# Patient Record
Sex: Female | Born: 1995 | Race: White | Hispanic: No | Marital: Single
Health system: Southern US, Community
[De-identification: ages and names within clinical notes are randomized; demographics above are authoritative.]

---

## 2020-04-21 ENCOUNTER — Emergency Department (HOSPITAL_COMMUNITY)
Admission: EM | Admit: 2020-04-21 | Discharge: 2020-04-21 | Disposition: A | Discharge status: Home / Self Care | Attending: Emergency Medicine | Admitting: Emergency Medicine

## 2020-04-21 ENCOUNTER — Other Ambulatory Visit: Payer: Self-pay

## 2020-04-21 ENCOUNTER — Emergency Department (HOSPITAL_COMMUNITY)

## 2020-04-21 ENCOUNTER — Encounter (HOSPITAL_COMMUNITY): Payer: Self-pay | Admitting: Emergency Medicine

## 2020-04-21 DIAGNOSIS — Y990 Civilian activity done for income or pay: Secondary | ICD-10-CM | POA: Insufficient documentation

## 2020-04-21 DIAGNOSIS — Y92230 Patient room in hospital as the place of occurrence of the external cause: Secondary | ICD-10-CM | POA: Insufficient documentation

## 2020-04-21 DIAGNOSIS — S060X9A Concussion with loss of consciousness of unspecified duration, initial encounter: Secondary | ICD-10-CM | POA: Insufficient documentation

## 2020-04-21 DIAGNOSIS — S060X0A Concussion without loss of consciousness, initial encounter: Secondary | ICD-10-CM

## 2020-04-21 DIAGNOSIS — W19XXXA Unspecified fall, initial encounter: Secondary | ICD-10-CM | POA: Diagnosis not present

## 2020-04-21 DIAGNOSIS — Y9389 Activity, other specified: Secondary | ICD-10-CM | POA: Diagnosis not present

## 2020-04-21 DIAGNOSIS — S069X9A Unspecified intracranial injury with loss of consciousness of unspecified duration, initial encounter: Secondary | ICD-10-CM | POA: Diagnosis present

## 2020-04-21 LAB — CBC
HCT: 42.2 % (ref 36.0–46.0)
Hemoglobin: 14.1 g/dL (ref 12.0–15.0)
MCH: 31 pg (ref 26.0–34.0)
MCHC: 33.4 g/dL (ref 30.0–36.0)
MCV: 92.7 fL (ref 80.0–100.0)
Platelets: 309 10*3/uL (ref 150–400)
RBC: 4.55 MIL/uL (ref 3.87–5.11)
RDW: 11.8 % (ref 11.5–15.5)
WBC: 5.7 10*3/uL (ref 4.0–10.5)
nRBC: 0 % (ref 0.0–0.2)

## 2020-04-21 LAB — BASIC METABOLIC PANEL
Anion gap: 10 (ref 5–15)
BUN: 9 mg/dL (ref 6–20)
CO2: 24 mmol/L (ref 22–32)
Calcium: 9.4 mg/dL (ref 8.9–10.3)
Chloride: 105 mmol/L (ref 98–111)
Creatinine, Ser: 0.84 mg/dL (ref 0.44–1.00)
GFR calc Af Amer: >60 mL/min (ref >60)
GFR calc non Af Amer: >60 mL/min (ref >60)
Glucose, Bld: 95 mg/dL (ref 70–99)
Potassium: 3.7 mmol/L (ref 3.5–5.1)
Sodium: 139 mmol/L (ref 135–145)

## 2020-04-21 LAB — URINALYSIS, ROUTINE W REFLEX MICROSCOPIC
Bilirubin Urine: NEGATIVE
Glucose, UA: NEGATIVE mg/dL
Hgb urine dipstick: NEGATIVE
Ketones, ur: NEGATIVE mg/dL
Leukocytes,Ua: NEGATIVE
Nitrite: NEGATIVE
Protein, ur: NEGATIVE mg/dL
Specific Gravity, Urine: 1.006 (ref 1.005–1.030)
pH: 7 (ref 5.0–8.0)

## 2020-04-21 LAB — I-STAT BETA HCG BLOOD, ED (MC, WL, AP ONLY): I-stat hCG, quantitative: <5 m[IU]/mL (ref <5)

## 2020-04-21 MED ORDER — SODIUM CHLORIDE 0.9% FLUSH: 3.0000 mL | Freq: Once | INTRAVENOUS | Status: DC

## 2020-04-21 NOTE — Discharge Instructions (Addendum)
Please continue to use your headache medication, take Tylenol ibuprofen for headaches.  Try to limit screen time as much as possible.  Please call the concussion clinic provided to schedule an appointment for further evaluation.  Your EKG showed that you may have been arrhythmia.  This is not life-threatening, but please follow-up with your primary care doctor to follow-up on this.  Return to the ER if your symptoms worsen.

## 2020-04-21 NOTE — ED Provider Notes (Signed)
  Face-to-face evaluation   History: She presents for evaluation of ongoing symptoms of nausea, blurred vision and headache, since the head injury with syncope, about a week ago.  The syncopal episode was preceded by feeling hot and weak while she was observing a clinical procedure.  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  She has had prior concussions in the past.  Physical exam: Alert, calm and cooperative.  No dysarthria or aphasia.  No facial asymmetry.  Normal coordination, arms and legs.  Medical screening examination/treatment/procedure(s) were conducted as a shared visit with non-physician practitioner(s) and myself.  I personally evaluated the patient during the encounter    Mancel Bale, MD 04/21/20 [id number]

## 2020-04-21 NOTE — ED Triage Notes (Signed)
Pt presents to ED POV. Pt c/o headache, vision changes. Pt reports that she had syncopal event 1w ago. Pt reports that she has hx of frequent syncopal events. Pt reports that she was not seen until today for event. Pt reports she hit head, no blood thinners. No other neuro defecits

## 2020-04-21 NOTE — ED Notes (Signed)
Patient verbalizes understanding of discharge instructions. Opportunity for questioning and answers were provided. Pt discharged from ED. 

## 2020-04-21 NOTE — ED Provider Notes (Signed)
[facility] EMERGENCY DEPARTMENT Provider Note   CSN: [id number] Arrival date & time: 04/21/20  4599     History Chief Complaint  Patient presents with  . Loss of Consciousness    Yvonne Hale is a 24 y.o. female.  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  She states she has a history of syncopal episodes from not eating or drinking enough.  She states she hit her head on the back of the wall, and had a small laceration that did not warrant suturing.  She states since then, she has had a constant headache, with blurry vision when she tries to focus on something far away.  She states that she has a history of migraines, which is usually relieved by her medications, and is normally not this constant.  She denies any fevers or chills.  She endorses a mild stiff neck, but is able to still move it without difficulty.  She endorses some nausea, but no vomiting.  She has not been medically evaluated since the fall.  She states she has a history of multiple concussions.  Denies any chest pain, shortness of breath, abdominal pain, fevers or chills.    History reviewed. No pertinent past medical history.  There are no problems to display for this patient.   The histories are not reviewed yet. Please review them in the "History" navigator section and refresh this SmartLink.   OB History   No obstetric history on file.     History reviewed. No pertinent family history.  Social History   Tobacco Use  . Smoking status: Not on file  Substance Use Topics  . Alcohol use: Not on file  . Drug use: Not on file    Home Medications Prior to Admission medications   Not  on File    Allergies    Patient has no known allergies.  Review of Systems   Review of Systems  Constitutional: Negative for chills and fever.  HENT: Negative for ear pain and sore throat.   Eyes: Positive for visual disturbance. Negative for pain.  Respiratory: Negative for cough and shortness of breath.   Cardiovascular: Negative for chest pain and palpitations.  Gastrointestinal: Negative for abdominal pain and vomiting.  Genitourinary: Negative for dysuria and hematuria.  Musculoskeletal: Positive for neck stiffness. Negative for arthralgias and back pain.  Skin: Negative for color change and rash.  Neurological: Positive for headaches. Negative for seizures and syncope.  All other systems reviewed and are negative.   Physical Exam Updated Vital Signs BP 125/84   Pulse 79   Temp 98.6 F (37 C) (Oral)   Resp (!) 22   Ht 5\' 4"  (1.626 m)   Wt 52.2 kg   LMP 03/15/2020 (Exact Date)   SpO2 100%   BMI 19.74 kg/m   Physical Exam Vitals and nursing note reviewed.  Constitutional:      General: She is not in acute distress.    Appearance: Normal appearance. She is well-developed. She is not ill-appearing, toxic-appearing or diaphoretic.  HENT:     Head: Normocephalic and atraumatic.     Nose: Nose normal.     Mouth/Throat:  Mouth: Mucous membranes are moist.     Pharynx: Oropharynx is clear.  Eyes:     General: Vision grossly intact. No visual field deficit.    Conjunctiva/sclera: Conjunctivae normal.     Pupils: Pupils are equal, round, and reactive to light.     Comments: Normal visual fields by confrontation.  Gross vision intact.  Neck:     Comments: Negative Kernig and Brudzinski's, sitting up, full ROM of neck  Cardiovascular:     Rate and Rhythm: Normal rate and regular rhythm.     Pulses: Normal pulses.     Heart sounds: Normal heart sounds. No murmur heard.   Pulmonary:     Effort: Pulmonary effort is normal. No respiratory distress.     Breath  sounds: Normal breath sounds.  Abdominal:     General: Abdomen is flat.     Palpations: Abdomen is soft.     Tenderness: There is no abdominal tenderness.  Musculoskeletal:        General: No tenderness or deformity. Normal range of motion.     Cervical back: Neck supple.  Skin:    General: Skin is warm and dry.     Capillary Refill: Capillary refill takes less than 2 seconds.     Findings: No bruising, erythema or rash.  Neurological:     General: No focal deficit present.     Mental Status: She is alert and oriented to person, place, and time.  Psychiatric:        Mood and Affect: Mood normal.        Behavior: Behavior normal.     ED Results / Procedures / Treatments   Labs (all labs ordered are listed, but only abnormal results are displayed) Labs Reviewed  URINALYSIS, ROUTINE W REFLEX MICROSCOPIC - Abnormal; Notable for the following components:      Result Value   Color, Urine STRAW (*)    All other components within normal limits  BASIC METABOLIC PANEL  CBC  CBG MONITORING, ED  I-STAT BETA HCG BLOOD, ED ([facility], [facility], [facility] ONLY)    EKG EKG Interpretation  Date/Time:  Friday April 21 2020 07:23:01 EDT Ventricular Rate:  94 PR Interval:  126 QRS Duration: 82 QT Interval:  336 QTC Calculation: 420 R Axis:   99 Text Interpretation: Normal sinus rhythm with sinus arrhythmia Right atrial enlargement Rightward axis Pulmonary disease pattern Abnormal ECG No old tracing to compare Confirmed by Mancel Bale [phone]) on 04/21/2020 9:58:50 AM   Radiology CT Head Wo Contrast  Result Date: 04/21/2020 CLINICAL DATA:  Posttraumatic headache after fall last week. EXAM: CT HEAD WITHOUT CONTRAST TECHNIQUE: Contiguous axial images were obtained from the base of the skull through the vertex without intravenous contrast. COMPARISON:  None. FINDINGS: Brain: No evidence of acute infarction, hemorrhage, hydrocephalus, extra-axial collection or mass lesion/mass effect. Vascular: No hyperdense  vessel or unexpected calcification. Skull: Normal. Negative for fracture or focal lesion. Sinuses/Orbits: No acute finding. Other: None. IMPRESSION: Normal head CT. Electronically Signed   By: Lupita Raider M.D.   On: 04/21/2020 08:25    Procedures Procedures (including critical care time)  Medications Ordered in ED Medications  sodium chloride flush (NS) 0.9 % injection 3 mL (has no administration in time range)    ED Course  I have reviewed the triage vital signs and the nursing notes.  Pertinent labs & imaging results that were available during my care of the patient were reviewed by me and considered in my medical decision  making (see chart for details).    MDM Rules/Calculators/A&P                          24 year old female with headache, blurry vision since syncopal episode last week On presentation, the patient is alert and oriented, nontoxic-appearing, in no acute distress, sitting up comfortably in the ER bed.  Vitals overall reassuring, she is afebrile.  Physical exam with intact visual fields by confrontation, gross vision intact.  Negative Kernig and presents case.  Moving all 4 extremities without difficulty.  BMP and CBC without any acute abnormalities.  No evidence of anemia.  UA without evidence of UTI.  CT ordered in triage without evidence of intracranial bleed.  EKG with normal sinus rhythm with sinus arrhythmia.  Orthostatic vitals normal.  Concern for intracranial bleed, meningitis, hypertensive urgency/emergency, retinal detachment, acute angle closure glaucoma low.  Suspect that her symptoms are consistent with concussion.  Encouraged her to continue to take Tylenol or ibuprofen for headaches.  Referral to concussion clinic provided.  Encouraged her to follow-up with her PCP given her slightly abnormal EKG.  Strict return precautions discussed.  She voices understanding and is agreeable to this plan.  At this stage in the ED course, the patient has been medically  screened and stable for discharge.  Patient was seen and evaluated by Dr. Effie Shy who is agreeable to the above plan and disposition. Final Clinical Impression(s) / ED Diagnoses Final diagnoses:  Concussion without loss of consciousness, initial encounter    Rx / DC Orders ED Discharge Orders    None       Mare Ferrari, PA-C 04/21/20 1021    Mancel Bale, MD 04/21/20 1623

## 2020-04-24 ENCOUNTER — Telehealth: Payer: Self-pay

## 2020-04-24 NOTE — Telephone Encounter (Signed)
Left message for patient to call back to be seen in concussion clinic.
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
## 2020-04-27 ENCOUNTER — Ambulatory Visit (INDEPENDENT_AMBULATORY_CARE_PROVIDER_SITE_OTHER): Admitting: Family Medicine

## 2020-04-27 ENCOUNTER — Encounter: Payer: Self-pay | Admitting: Family Medicine

## 2020-04-27 ENCOUNTER — Other Ambulatory Visit: Payer: Self-pay

## 2020-04-27 DIAGNOSIS — S060X9A Concussion with loss of consciousness of unspecified duration, initial encounter: Secondary | ICD-10-CM | POA: Diagnosis not present
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
## 2020-04-27 NOTE — Patient Instructions (Signed)
Good to see you 200mg  CoQ10 2 grams of Fish oil Choline 500mg  daily  2000 IU of Vitamin D Phone call in 10-14 days

## 2020-04-27 NOTE — Assessment & Plan Note (Signed)
Patient did have a syncopal episode.  Did not hit her back of her head and is having signs and symptoms noted that is consistent with more of an ocular concussion.  Discussed keeping it as natural as possible and discussed over-the-counter medications.  We discussed which activities to do which wants to avoid.  Discussed with patient only having 2 more work schedule before patient starts school we do not need to write her out.  Patient will follow-up by virtual in 2 weeks

## 2020-05-08 ENCOUNTER — Encounter: Payer: Self-pay | Admitting: Family Medicine

## 2020-05-08 ENCOUNTER — Ambulatory Visit (INDEPENDENT_AMBULATORY_CARE_PROVIDER_SITE_OTHER): Admitting: Family Medicine

## 2020-05-08 DIAGNOSIS — S060X9D Concussion with loss of consciousness of unspecified duration, subsequent encounter: Secondary | ICD-10-CM | POA: Diagnosis not present

## 2020-05-08 NOTE — Progress Notes (Signed)
Virtual Visit via Video Note  I connected with Philippa Chester on 05/08/20 at  4:15 PM EDT by a video enabled telemedicine application and verified that I am speaking with the correct person using two identifiers.  Location: Patient: Patient is outside in the [place] difficulty with virtual platform so did on the phone call Provider: In office setting   I discussed the limitations of evaluation and management by telemedicine and the availability of in person appointments. The patient expressed understanding and agreed to proceed.  History of Present Illness: 24 year old with a syncopal episode who did hit head having ocular migraines.  Patient states that headaches and all other symptoms are completely resolved except for still the visual changes.  Patient states is worse with driving or more complex type things.  Has not been very active.  Taking vitamins no side effect of medications    Observations/Objective: Alert and oriented x3, asking good questions.   Assessment and Plan: 24 year old with mild ocular concussion with improvement except for some of the visual portions.  Patient states that before the head injury patient did see the ophthalmologist who was told her that she did have astigmatism but was not going to correct it with the contacts.  Does have some glasses that corrected and seems to do a little better.  Patient is to increase activity slowly still but just to start working out more regularly.  No change in medication but we did discuss potentially backing off a little bit.  Follow-up again 2 weeks if not perfect    Follow Up Instructions: 2 weeks     I discussed the assessment and treatment plan with the patient. The patient was provided an opportunity to ask questions and all were answered. The patient agreed with the plan and demonstrated an understanding of the instructions.   The patient was advised to call back or seek an in-person evaluation if the symptoms worsen  or if the condition fails to improve as anticipated.  I provided 12 minutes of face-to-face time during this encounter.  Difficulty with virtual platform.   Judi Saa, DO

## 2020-05-13 ENCOUNTER — Encounter: Payer: Self-pay | Admitting: Family Medicine

## 2021-01-16 IMAGING — CT CT HEAD W/O CM
3 series · 15 of 46 positions shown, 18 images · non-contrast
Comparison: None.

CLINICAL DATA: Posttraumatic headache after fall last week.

EXAM:
CT HEAD WITHOUT CONTRAST
TECHNIQUE: Contiguous axial images were obtained from the base of the skull
through the vertex without intravenous contrast.

[Series 3: head 5.0 h30s · axial · 0.45mm/px · z∈[-49,+71]mm · 9 of 29 slices shown, 12 images]
[im 3/29  brain]
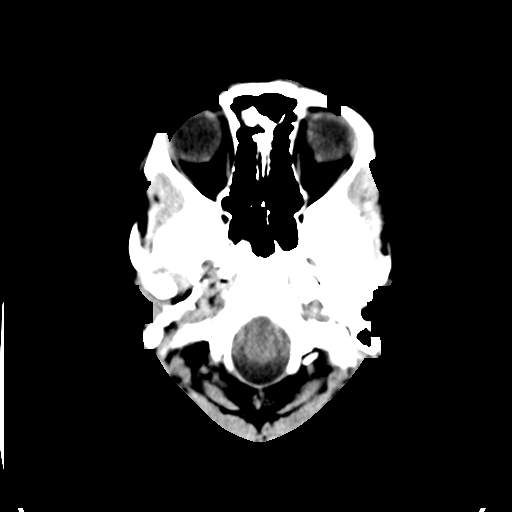
[im 3/29  bone]
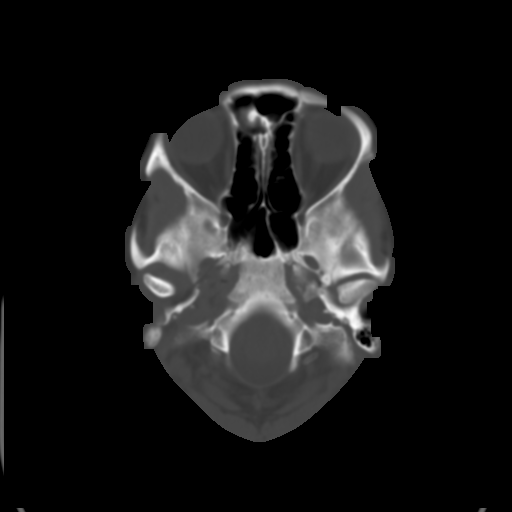
[im 6/29  brain]
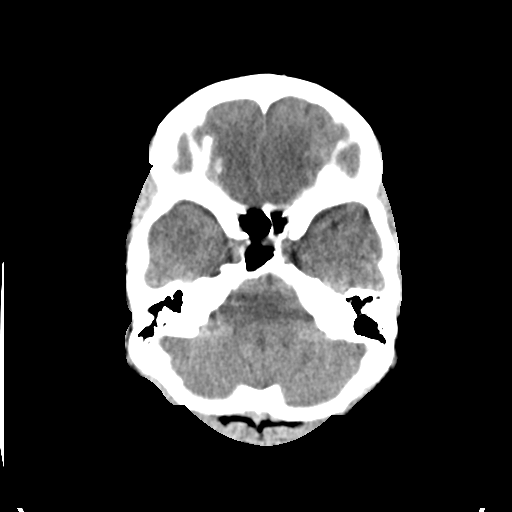
[im 9/29  brain]
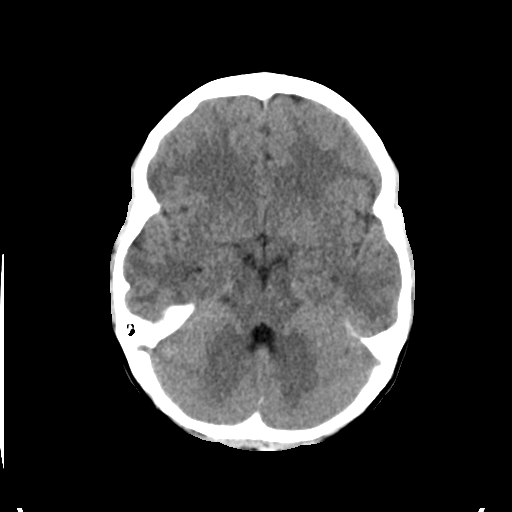
[im 12/29  brain]
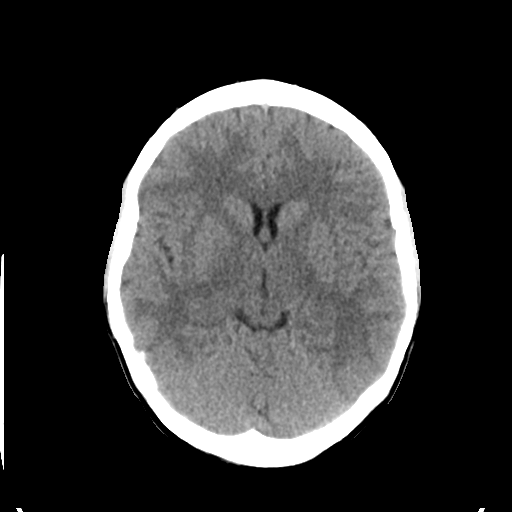
[im 15/29  brain]
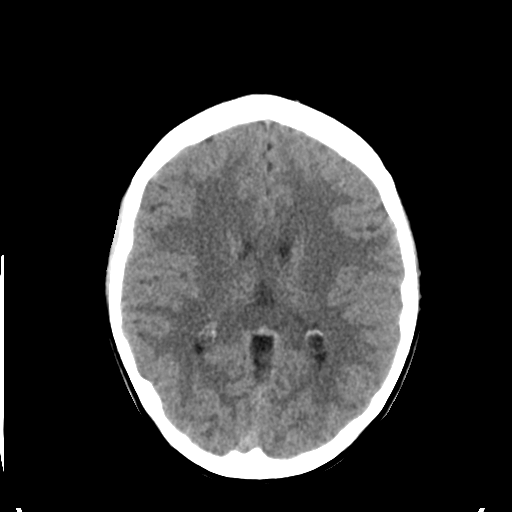
[im 15/29  bone]
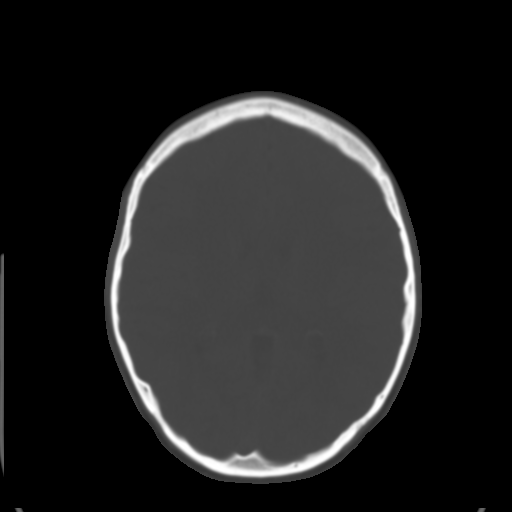
[im 18/29  brain]
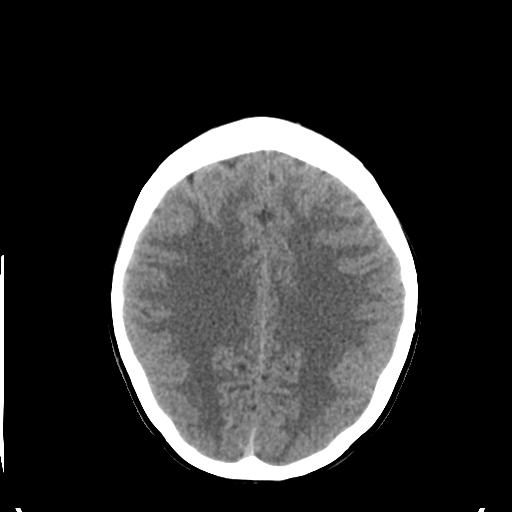
[im 21/29  brain]
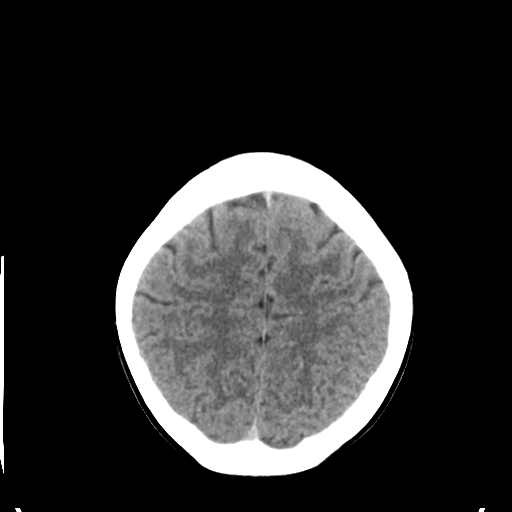
[im 24/29  brain]
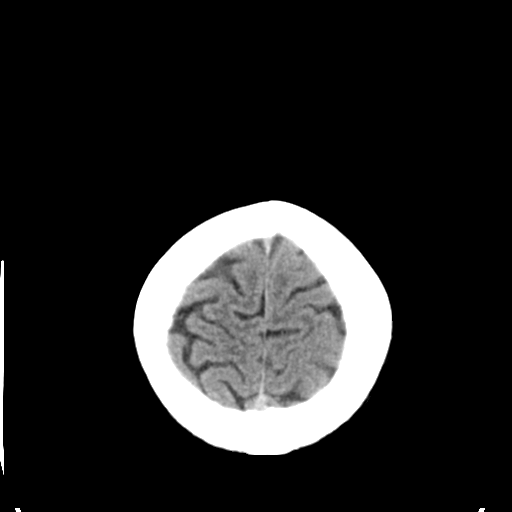
[im 27/29  brain]
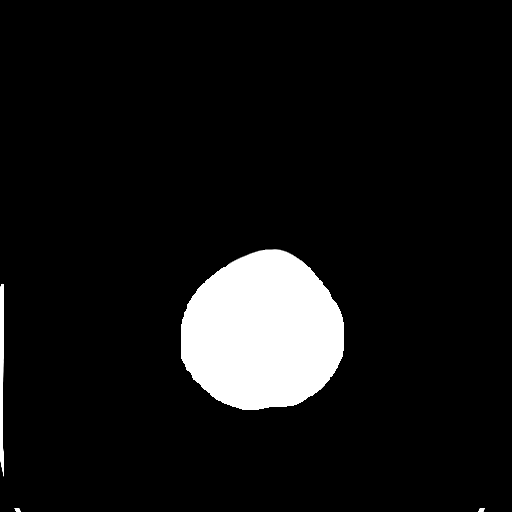
[im 27/29  bone]
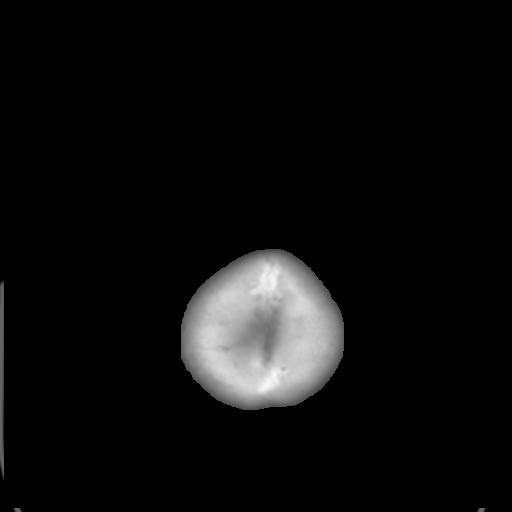

[Series 5: head 3.0 mpr cor · coronal · 0.29mm/px · 3 of 67 slices shown]
[im 23/67  brain]
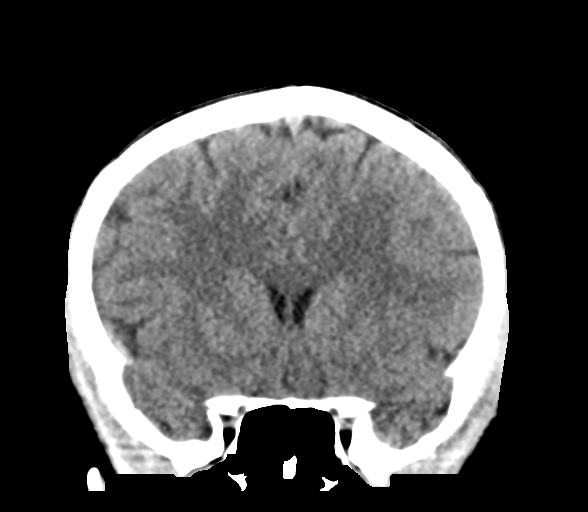
[im 30/67  brain]
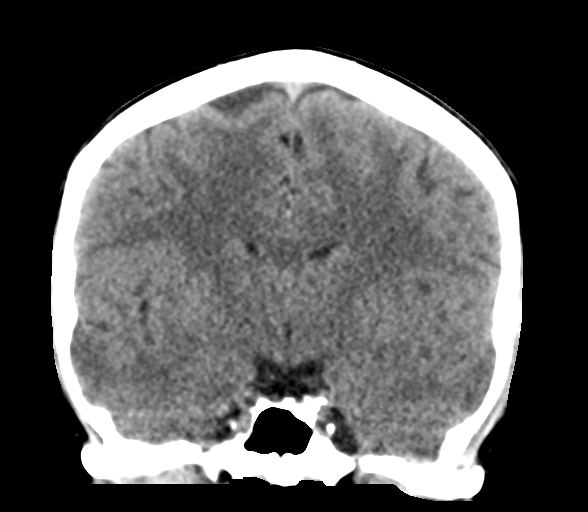
[im 37/67  brain]
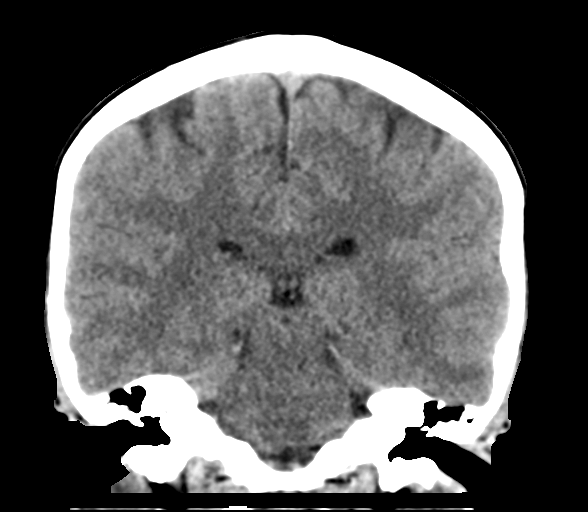

[Series 6: head 3.0 mpr sag · sagittal · 0.28mm/px · 3 of 67 slices shown]
[im 23/67  brain]
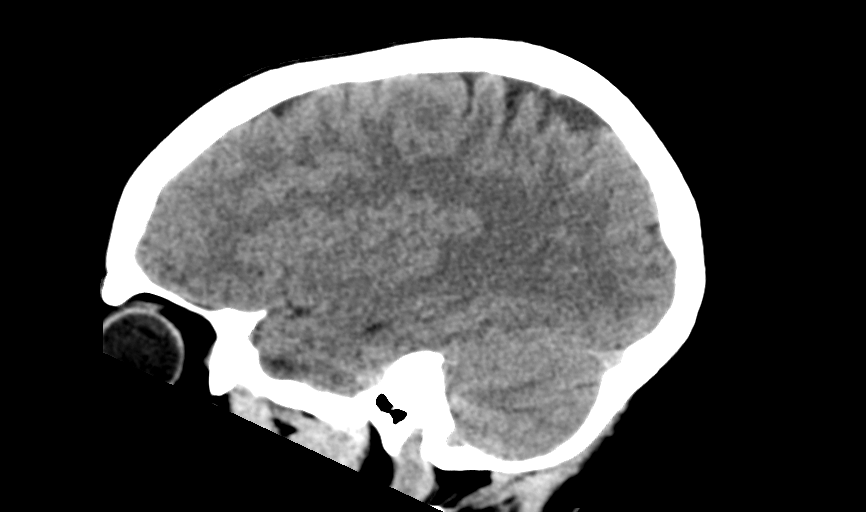
[im 34/67  brain]
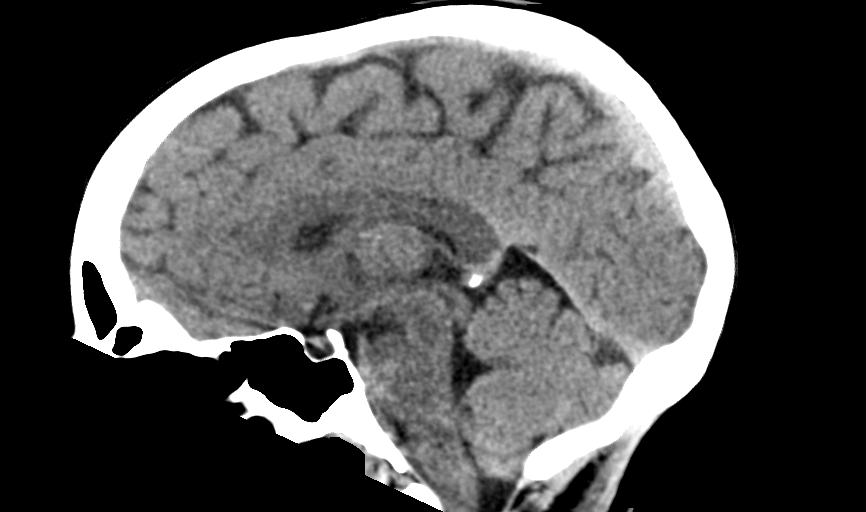
[im 45/67  brain]
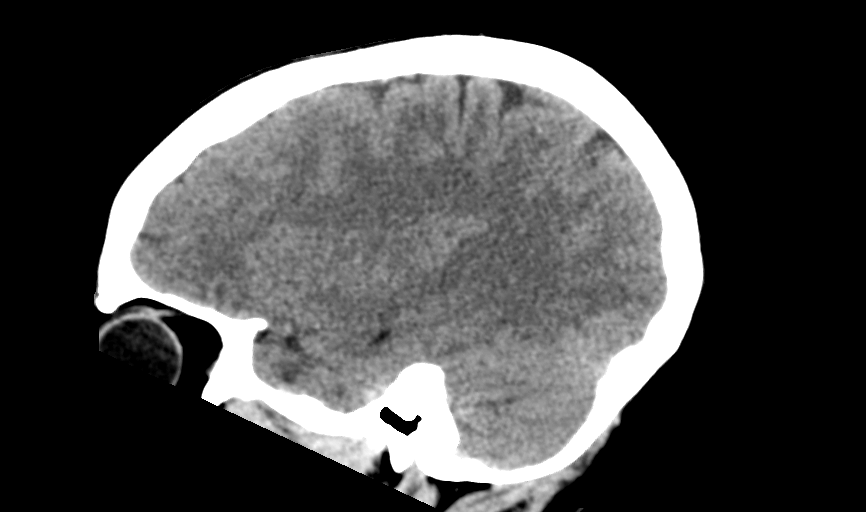

[15 of 46 positions shown; findings below may reference images not displayed]

FINDINGS: Brain: No evidence of acute infarction, hemorrhage, hydrocephalus,
extra-axial collection or mass lesion/mass effect.

Vascular: No hyperdense vessel or unexpected calcification.

Skull: Normal. Negative for fracture or focal lesion.

Sinuses/Orbits: No acute finding.

Other: None.
IMPRESSION: Normal head CT.
